# Patient Record
Sex: Female | Born: 1970 | Race: White | Hispanic: No | Marital: Married
Health system: Southern US, Community
[De-identification: ages and names within clinical notes are randomized; demographics above are authoritative.]

---

## 2015-02-07 ENCOUNTER — Other Ambulatory Visit (HOSPITAL_COMMUNITY): Payer: Self-pay | Admitting: Orthopaedic Surgery

## 2015-02-07 DIAGNOSIS — M5442 Lumbago with sciatica, left side: Secondary | ICD-10-CM

## 2015-02-14 ENCOUNTER — Ambulatory Visit (HOSPITAL_COMMUNITY)
Admission: RE | Admit: 2015-02-14 | Discharge: 2015-02-14 | Disposition: A | Discharge status: Home / Self Care | Payer: Self-pay | Source: Ambulatory Visit | Attending: Orthopaedic Surgery | Admitting: Orthopaedic Surgery

## 2015-03-25 ENCOUNTER — Other Ambulatory Visit (HOSPITAL_COMMUNITY): Payer: Self-pay | Admitting: Orthopaedic Surgery

## 2015-03-25 ENCOUNTER — Ambulatory Visit (HOSPITAL_COMMUNITY): Payer: No Typology Code available for payment source

## 2015-03-25 DIAGNOSIS — M5442 Lumbago with sciatica, left side: Secondary | ICD-10-CM

## 2015-03-27 ENCOUNTER — Ambulatory Visit (HOSPITAL_COMMUNITY): Payer: No Typology Code available for payment source

## 2015-03-27 ENCOUNTER — Ambulatory Visit (HOSPITAL_COMMUNITY): Admission: RE | Admit: 2015-03-27 | Payer: No Typology Code available for payment source | Source: Ambulatory Visit

## 2018-06-27 ENCOUNTER — Other Ambulatory Visit: Payer: Self-pay | Admitting: Orthopaedic Surgery

## 2018-06-27 DIAGNOSIS — M4325 Fusion of spine, thoracolumbar region: Secondary | ICD-10-CM

## 2018-07-04 ENCOUNTER — Ambulatory Visit (INDEPENDENT_AMBULATORY_CARE_PROVIDER_SITE_OTHER): Payer: Self-pay | Admitting: Neurology

## 2018-07-04 ENCOUNTER — Ambulatory Visit (INDEPENDENT_AMBULATORY_CARE_PROVIDER_SITE_OTHER): Payer: BLUE CROSS/BLUE SHIELD | Admitting: Neurology

## 2018-07-04 ENCOUNTER — Encounter: Payer: Self-pay | Admitting: Neurology

## 2018-07-04 DIAGNOSIS — R202 Paresthesia of skin: Secondary | ICD-10-CM | POA: Diagnosis not present

## 2018-07-04 NOTE — Progress Notes (Signed)
[facility]    Nerve / Sites Muscle Latency Ref. Amplitude Ref. Rel Amp Segments Distance Velocity Ref. Area    ms ms mV mV %  cm m/s m/s mVms  R Peroneal - EDB     Ankle EDB 5.8 ?6.5 7.4 ?2.0 100 Ankle - EDB 9   28.1     Fib head EDB 11.5  7.1  95.8 Fib head - Ankle 28 49 ?44 27.8     Pop fossa EDB 13.3  7.8  110 Pop fossa - Fib head 10 53 ?44 30.2         Pop fossa - Ankle      L Peroneal - EDB     Ankle EDB 5.5 ?6.5 4.7 ?2.0 100 Ankle - EDB 9   14.5     Fib head EDB 10.9  4.4  94.2 Fib head - Ankle 28 52 ?44 14.3     Pop fossa EDB 12.8  4.1  91.8 Pop fossa - Fib head 10 53 ?44 12.9         Pop fossa - Ankle      R Tibial - AH     Ankle AH 4.2 ?5.8 16.9 ?4.0 100 Ankle - AH 9   42.9     Pop fossa AH 11.6  14.5  85.7 Pop fossa - Ankle 36 48 ?41 44.3  L Tibial - AH     Ankle AH 3.1 ?5.8 10.4 ?4.0 100 Ankle - AH 9   24.4     Pop fossa AH 10.7  9.9  95.9 Pop fossa - Ankle 36 47 ?41 24.4             [facility]    Nerve / Sites Rec. Site Peak Lat Ref.  Amp Ref. Segments Distance    ms ms V V  cm  R Sural - Ankle (Calf)     Calf Ankle 3.9 ?4.4 24 ?6 Calf - Ankle 14  L Sural - Ankle (Calf)     Calf Ankle 3.3 ?4.4 16 ?6 Calf - Ankle 14  R Superficial peroneal - Ankle     Lat leg Ankle 4.0 ?4.4 11 ?6 Lat leg - Ankle 14  L Superficial peroneal - Ankle     Lat leg Ankle 3.9 ?4.4 13 ?6 Lat leg - Ankle 14              F  Wave    Nerve F Lat Ref.   ms ms  R Tibial - AH 44.9 ?56.0  L Tibial - AH 44.1 ?56.0         EMG full       EMG Summary Table    Spontaneous MUAP Recruitment  Muscle IA Fib PSW Fasc Other Amp Dur. Poly Pattern  L. Abductor digiti minimi (manus) Increased None None None _______ Normal Normal Normal Normal

## 2018-07-04 NOTE — Procedures (Signed)
     HISTORY:  Felicia Chapman is a 47 year old patient with a history of extensive spine surgery involving the C 5 through 7 level and the thoracic level 9 through the sacral level.  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  The patient has noted since August 2019 that she has had brief events lasting only a few seconds of numbness shooting from the left hip level down to the foot associated with collapse of the leg.  The patient has not had any falls.  She is being evaluated for this issue.  NERVE CONDUCTION STUDIES:  Nerve conduction studies were performed on both lower extremities. The distal motor latencies and motor amplitudes for the peroneal and posterior tibial nerves were within normal limits. The nerve conduction velocities for these nerves were also normal. The sensory latencies for the peroneal and sural nerves were within normal limits. The F wave latencies for the posterior tibial nerves were within normal limits.   EMG STUDIES:  EMG study was performed on the left lower extremity:  The tibialis anterior muscle reveals 2 to 4K motor units with full recruitment. No fibrillations or positive waves were seen. The peroneus tertius muscle reveals 2 to 4K motor units with full recruitment. No fibrillations or positive waves were seen. The medial gastrocnemius muscle reveals 1 to 3K motor units with full recruitment. No fibrillations or positive waves were seen. The vastus lateralis muscle reveals 2 to 4K motor units with full recruitment. No fibrillations or positive waves were seen. The iliopsoas muscle reveals 2 to 4K motor units with full recruitment. No fibrillations or positive waves were seen. The biceps femoris muscle (long head) reveals 2 to 4K motor units with full recruitment. No fibrillations or positive waves were seen. The lumbosacral paraspinal muscles were tested at 3 levels, and revealed no abnormalities of insertional activity at all 3 levels tested.  There was good relaxation.   IMPRESSION:  Nerve conduction studies done on both lower extremities were unremarkable, no evidence of a peripheral neuropathy was seen.  EMG evaluation of the left lower extremity was unremarkable, no evidence of an overlying lumbosacral radiculopathy was noted.  Marlan Palau MD 07/04/2018 9:15 AM  [facility] [address] [place], [place] [address]  Phone [phone] Fax [phone]

## 2018-07-04 NOTE — Progress Notes (Signed)
Please refer to EMG and nerve conduction procedure note.  

## 2018-07-10 ENCOUNTER — Ambulatory Visit
Admission: RE | Admit: 2018-07-10 | Discharge: 2018-07-10 | Disposition: A | Discharge status: Home / Self Care | Payer: BLUE CROSS/BLUE SHIELD | Source: Ambulatory Visit | Attending: Orthopaedic Surgery | Admitting: Orthopaedic Surgery

## 2018-07-10 DIAGNOSIS — M4325 Fusion of spine, thoracolumbar region: Secondary | ICD-10-CM

## 2018-08-01 ENCOUNTER — Other Ambulatory Visit: Payer: Self-pay | Admitting: Orthopaedic Surgery

## 2018-08-01 DIAGNOSIS — M546 Pain in thoracic spine: Secondary | ICD-10-CM

## 2018-08-04 ENCOUNTER — Other Ambulatory Visit: Payer: BLUE CROSS/BLUE SHIELD

## 2018-08-08 ENCOUNTER — Encounter: Payer: Self-pay | Admitting: Radiology

## 2018-08-08 ENCOUNTER — Ambulatory Visit
Admission: RE | Admit: 2018-08-08 | Discharge: 2018-08-08 | Disposition: A | Discharge status: Home / Self Care | Payer: BLUE CROSS/BLUE SHIELD | Source: Ambulatory Visit | Attending: Orthopaedic Surgery | Admitting: Orthopaedic Surgery

## 2018-08-08 DIAGNOSIS — M546 Pain in thoracic spine: Secondary | ICD-10-CM

## 2018-09-07 ENCOUNTER — Other Ambulatory Visit: Payer: Self-pay | Admitting: Orthopaedic Surgery

## 2018-09-07 DIAGNOSIS — M4322 Fusion of spine, cervical region: Secondary | ICD-10-CM

## 2018-09-12 ENCOUNTER — Other Ambulatory Visit: Payer: Self-pay | Admitting: Orthopaedic Surgery

## 2018-09-15 ENCOUNTER — Ambulatory Visit
Admission: RE | Admit: 2018-09-15 | Discharge: 2018-09-15 | Disposition: A | Discharge status: Home / Self Care | Payer: BLUE CROSS/BLUE SHIELD | Source: Ambulatory Visit | Attending: Orthopaedic Surgery | Admitting: Orthopaedic Surgery

## 2018-09-15 DIAGNOSIS — M4322 Fusion of spine, cervical region: Secondary | ICD-10-CM

## 2019-01-15 ENCOUNTER — Other Ambulatory Visit: Payer: Self-pay | Admitting: Rehabilitation

## 2019-01-15 DIAGNOSIS — M4322 Fusion of spine, cervical region: Secondary | ICD-10-CM

## 2019-01-19 ENCOUNTER — Ambulatory Visit
Admission: RE | Admit: 2019-01-19 | Discharge: 2019-01-19 | Disposition: A | Discharge status: Home / Self Care | Payer: BLUE CROSS/BLUE SHIELD | Source: Ambulatory Visit | Attending: Rehabilitation | Admitting: Rehabilitation

## 2019-01-19 DIAGNOSIS — M4322 Fusion of spine, cervical region: Secondary | ICD-10-CM

## 2020-06-18 ENCOUNTER — Other Ambulatory Visit: Payer: Self-pay | Admitting: Rehabilitation

## 2020-06-18 DIAGNOSIS — M47814 Spondylosis without myelopathy or radiculopathy, thoracic region: Secondary | ICD-10-CM

## 2020-07-06 ENCOUNTER — Ambulatory Visit
Admission: RE | Admit: 2020-07-06 | Discharge: 2020-07-06 | Disposition: A | Discharge status: Home / Self Care | Payer: Medicare HMO | Source: Ambulatory Visit | Attending: Rehabilitation | Admitting: Rehabilitation

## 2020-07-06 ENCOUNTER — Other Ambulatory Visit: Payer: Self-pay

## 2020-07-06 DIAGNOSIS — M47814 Spondylosis without myelopathy or radiculopathy, thoracic region: Secondary | ICD-10-CM

## 2020-07-06 MED ORDER — GADOBENATE DIMEGLUMINE 529 MG/ML IV SOLN
15.0000 mL | Freq: Once | INTRAVENOUS | Status: AC | PRN
  Administered 2020-07-06: 15 mL via INTRAVENOUS

## 2021-04-03 ENCOUNTER — Other Ambulatory Visit: Payer: Self-pay | Admitting: Rehabilitation

## 2021-04-03 DIAGNOSIS — M5412 Radiculopathy, cervical region: Secondary | ICD-10-CM

## 2021-04-21 ENCOUNTER — Ambulatory Visit
Admission: RE | Admit: 2021-04-21 | Discharge: 2021-04-21 | Disposition: A | Discharge status: Home / Self Care | Payer: Medicare HMO | Source: Ambulatory Visit | Attending: Rehabilitation | Admitting: Rehabilitation

## 2021-04-21 ENCOUNTER — Other Ambulatory Visit: Payer: Self-pay

## 2021-04-21 DIAGNOSIS — M5412 Radiculopathy, cervical region: Secondary | ICD-10-CM

## 2021-04-21 MED ORDER — GADOBENATE DIMEGLUMINE 529 MG/ML IV SOLN
15.0000 mL | Freq: Once | INTRAVENOUS | Status: AC | PRN
  Administered 2021-04-21: 15 mL via INTRAVENOUS

## 2022-03-04 ENCOUNTER — Other Ambulatory Visit: Payer: Self-pay | Admitting: Obstetrics and Gynecology

## 2022-03-04 DIAGNOSIS — R928 Other abnormal and inconclusive findings on diagnostic imaging of breast: Secondary | ICD-10-CM

## 2022-03-10 ENCOUNTER — Ambulatory Visit: Payer: Medicare HMO

## 2022-03-10 ENCOUNTER — Ambulatory Visit
Admission: RE | Admit: 2022-03-10 | Discharge: 2022-03-10 | Disposition: A | Discharge status: Home / Self Care | Payer: Medicare HMO | Source: Ambulatory Visit | Attending: Obstetrics and Gynecology | Admitting: Obstetrics and Gynecology

## 2022-03-10 DIAGNOSIS — R928 Other abnormal and inconclusive findings on diagnostic imaging of breast: Secondary | ICD-10-CM

## 2022-12-01 IMAGING — MR MR CERVICAL SPINE WO/W CM
4 of 8 series · 23 of 48 positions shown · IV contrast (15ml Multihance)
Comparison: Prior MRI from 09/15/2018.

CLINICAL DATA: Initial evaluation for cervical radiculitis, pain
and numbness in left upper extremity for 6-7 months, worsening.
Prior surgery.

EXAM:
MRI CERVICAL SPINE WITHOUT AND WITH CONTRAST
TECHNIQUE: Multiplanar and multiecho pulse sequences of the cervical spine, to
include the craniocervical junction and cervicothoracic junction,
were obtained without and with intravenous contrast.
CONTRAST:  15mL MULTIHANCE GADOBENATE DIMEGLUMINE 529 MG/ML IV SOLN

[Series 3: T1 · sagittal · 3.0mm · 0.41mm/px · 4 of 13 slices shown (1 of 2)]
[im 1/13]
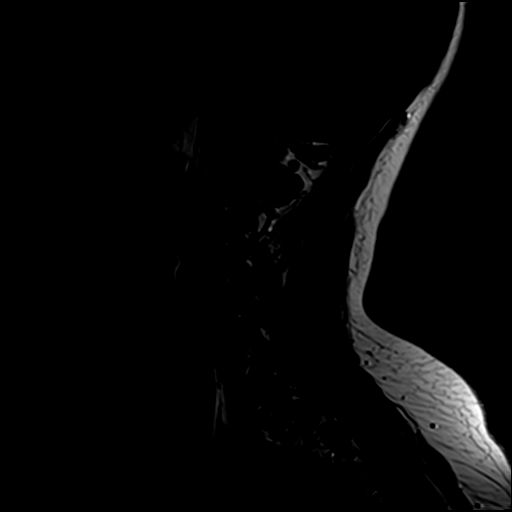
[im 5/13]
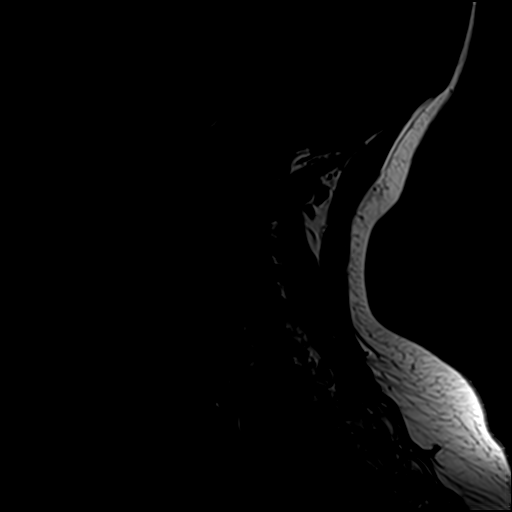
[im 9/13]
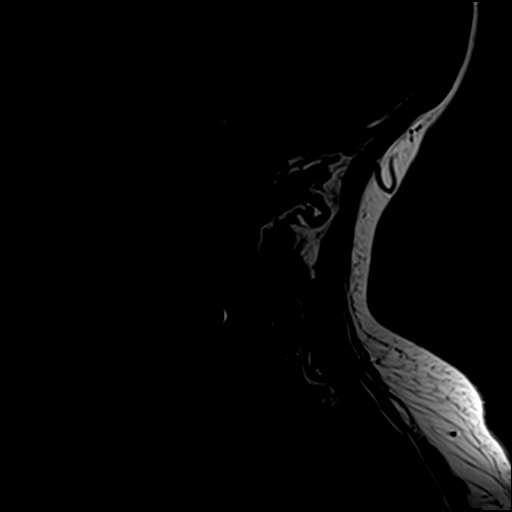
[im 13/13]
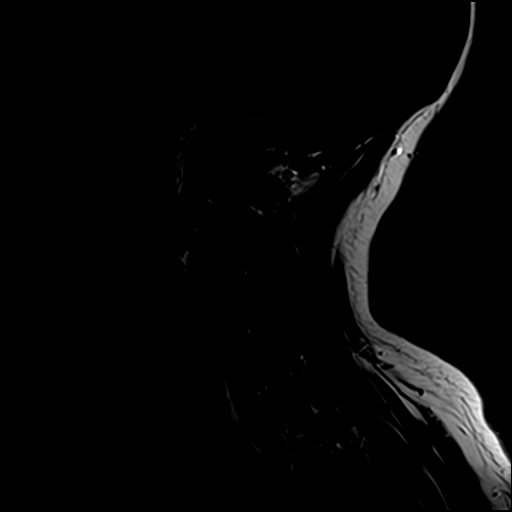

[Series 5: T2 · axial · 3.0mm · 0.70mm/px · z∈[-87,+10]mm · 8 of 27 slices shown (1 of 2)]
[im 1/27]
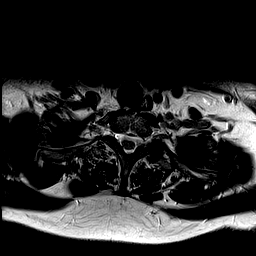
[im 4/27]
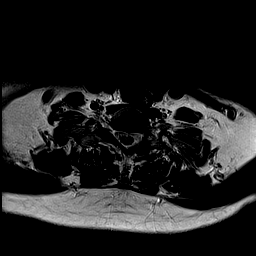
[im 8/27]
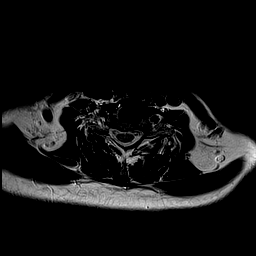
[im 12/27]
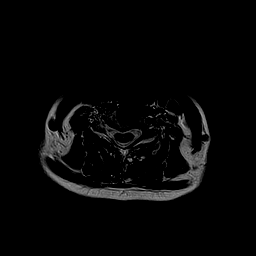
[im 15/27]
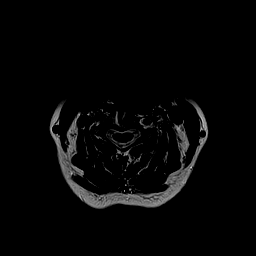
[im 19/27]
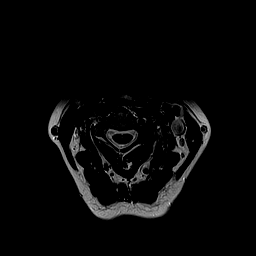
[im 23/27]
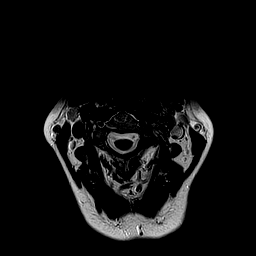
[im 27/27]
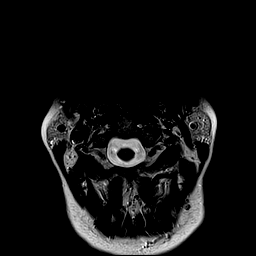

[Series 6: T1 · axial · 3.0mm · 0.35mm/px · z∈[-85,-3]mm · 7 of 27 slices shown (2 of 2)]
[im 1/27]
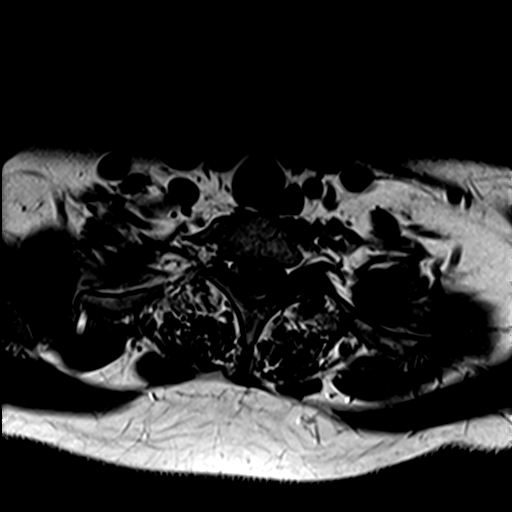
[im 4/27]
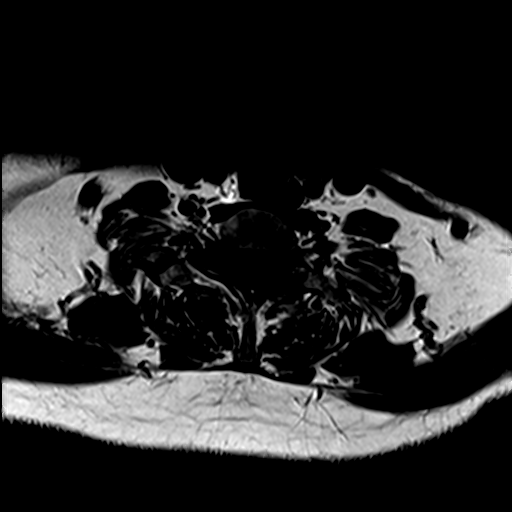
[im 8/27]
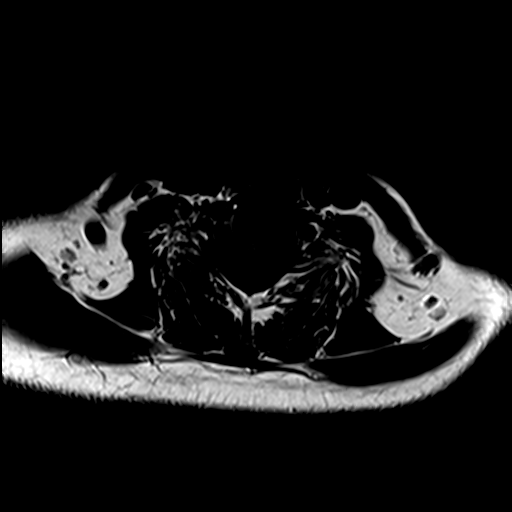
[im 12/27]
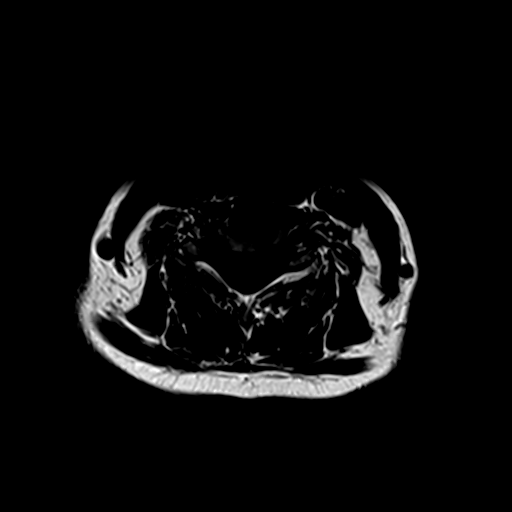
[im 15/27]
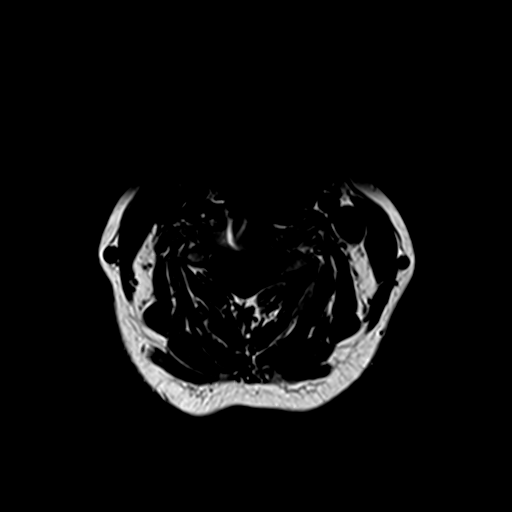
[im 19/27]
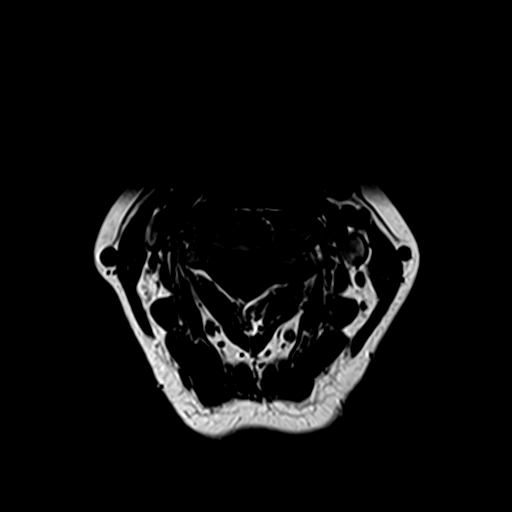
[im 23/27]
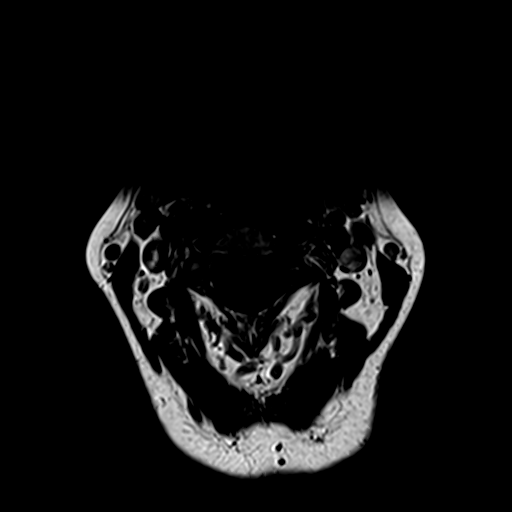

[Series 7: T2 · sagittal · 3.0mm · 0.41mm/px · 4 of 13 slices shown (2 of 2)]
[im 1/13]
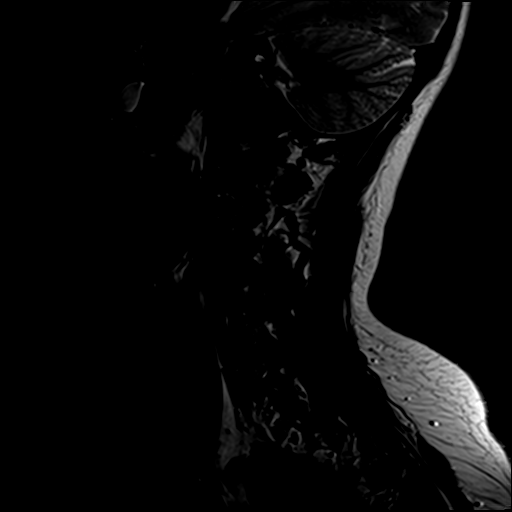
[im 5/13]
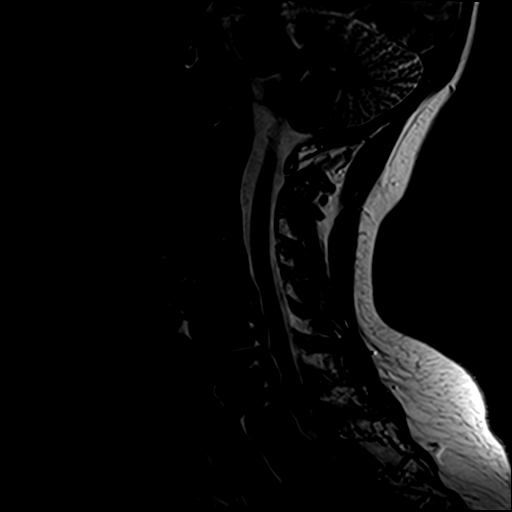
[im 9/13]
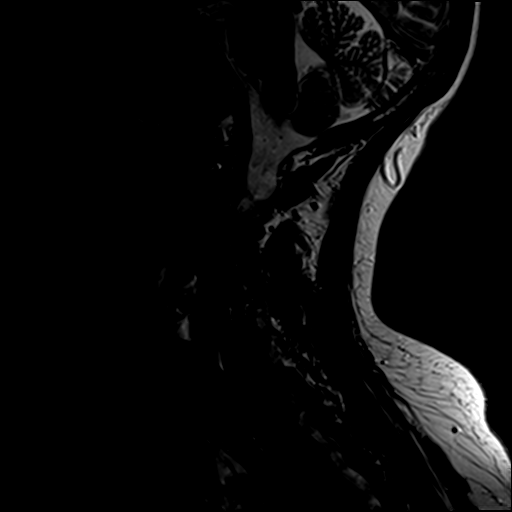
[im 13/13]
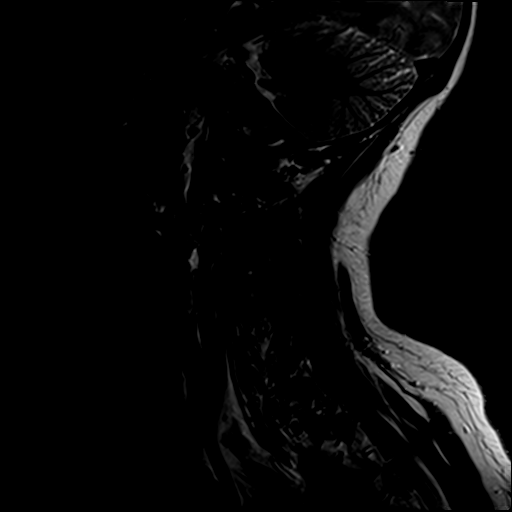

[23 of 48 positions shown; findings below may reference images not displayed]

FINDINGS: Alignment: Mild straightening of the normal cervical lordosis.
Underlying trace dextroscoliosis. No significant listhesis.

Vertebrae: Prior ACDF at C5-C7. Vertebral body height maintained
without acute or chronic fracture. Bone marrow signal intensity
within normal limits. No discrete or worrisome osseous lesions. No
abnormal marrow edema or enhancement.

Cord: Normal signal and morphology.

Posterior Fossa, vertebral arteries, paraspinal tissues: Visualized
brain and posterior fossa within normal limits. Craniocervical
junction normal. Paraspinous and prevertebral soft tissues within
normal limits. Normal intravascular flow voids seen within the
vertebral arteries bilaterally.

Disc levels:

C2-C3: Negative interspace. Left-sided facet hypertrophy. No spinal
stenosis. Foramina remain patent.

C3-C4: Mild disc bulge with bilateral uncovertebral hypertrophy.
Moderate left-sided facet degeneration. No spinal stenosis. Severe
left C4 foraminal stenosis, progressed from prior.

C4-C5: Mild disc bulge with uncovertebral spurring. Mild to moderate
left-sided facet arthrosis. Bulging disc mildly indents the ventral
thecal sac without significant spinal stenosis. Mild left C5
foraminal narrowing. Right neural foramen remains patent.

C5-C6: Prior fusion. Right paracentral endplate spurring indents the
right ventral thecal sac with secondary flattening of the ventral
cord (series 5, image 15), stable. No significant spinal stenosis.
Left-sided uncovertebral spurring with borderline mild left C6
foraminal narrowing, stable.

C6-C7: Prior fusion. No residual spinal stenosis. Bilateral uncinate
spurring with resultant mild left greater than right C7 foraminal
narrowing, stable.

C7-T1: Mild disc bulge. Moderate left worse than right facet
hypertrophy. No significant spinal stenosis. Mild right with mild to
moderate left C8 foraminal stenosis, mildly progressed.

Visualized upper thoracic spine demonstrates no significant finding.
IMPRESSION: 1. Prior ACDF at C5-C7 without significant residual spinal stenosis,
stable.
2. Progressive severe left C4 foraminal stenosis related to
uncovertebral and facet degeneration.
3. Progressive facet degeneration at C7-T1 with resultant mild to
moderate left C8 foraminal stenosis.

## 2023-01-19 ENCOUNTER — Other Ambulatory Visit: Payer: Self-pay | Admitting: *Deleted

## 2023-01-19 DIAGNOSIS — M546 Pain in thoracic spine: Secondary | ICD-10-CM

## 2023-01-19 DIAGNOSIS — M4325 Fusion of spine, thoracolumbar region: Secondary | ICD-10-CM

## 2023-01-27 ENCOUNTER — Encounter: Payer: Self-pay | Admitting: *Deleted

## 2023-01-28 ENCOUNTER — Other Ambulatory Visit: Payer: Medicare HMO

## 2023-12-19 ENCOUNTER — Other Ambulatory Visit: Payer: Self-pay | Admitting: Rehabilitation

## 2023-12-19 DIAGNOSIS — M5412 Radiculopathy, cervical region: Secondary | ICD-10-CM

## 2023-12-28 ENCOUNTER — Other Ambulatory Visit

## 2024-01-05 ENCOUNTER — Encounter: Payer: Self-pay | Admitting: Rehabilitation

## 2024-01-07 ENCOUNTER — Ambulatory Visit
Admission: RE | Admit: 2024-01-07 | Discharge: 2024-01-07 | Disposition: A | Discharge status: Home / Self Care | Source: Ambulatory Visit | Attending: Rehabilitation | Admitting: Rehabilitation

## 2024-01-07 DIAGNOSIS — M5412 Radiculopathy, cervical region: Secondary | ICD-10-CM

## 2024-01-07 MED ORDER — GADOPICLENOL 0.5 MMOL/ML IV SOLN
7.0000 mL | Freq: Once | INTRAVENOUS | Status: AC | PRN
  Administered 2024-01-07: 7 mL via INTRAVENOUS
